# Patient Record
Sex: Female | Born: 1980 | Hispanic: Yes | Marital: Single | Smoking: Never smoker
Health system: Southern US, Community
[De-identification: ages and names within clinical notes are randomized; demographics above are authoritative.]

## PROBLEM LIST (undated history)

## (undated) DIAGNOSIS — O24419 Gestational diabetes mellitus in pregnancy, unspecified control: Secondary | ICD-10-CM

## (undated) DIAGNOSIS — E7800 Pure hypercholesterolemia, unspecified: Secondary | ICD-10-CM

## (undated) HISTORY — DX: Pure hypercholesterolemia, unspecified: E78.00

## (undated) HISTORY — PX: APPENDECTOMY: SHX54

## (undated) HISTORY — DX: Gestational diabetes mellitus in pregnancy, unspecified control: O24.419

---

## 2021-11-09 ENCOUNTER — Other Ambulatory Visit: Payer: Self-pay

## 2021-11-09 DIAGNOSIS — N644 Mastodynia: Secondary | ICD-10-CM

## 2021-12-17 ENCOUNTER — Ambulatory Visit
Admission: RE | Admit: 2021-12-17 | Discharge: 2021-12-17 | Disposition: A | Discharge status: Home / Self Care | Payer: No Typology Code available for payment source | Source: Ambulatory Visit | Attending: Obstetrics and Gynecology | Admitting: Obstetrics and Gynecology

## 2021-12-17 ENCOUNTER — Ambulatory Visit: Payer: Self-pay | Admitting: *Deleted

## 2021-12-17 ENCOUNTER — Ambulatory Visit
Admission: RE | Admit: 2021-12-17 | Discharge: 2021-12-17 | Disposition: A | Discharge status: Home / Self Care | Payer: Self-pay | Source: Ambulatory Visit | Attending: Obstetrics and Gynecology | Admitting: Obstetrics and Gynecology

## 2021-12-17 VITALS — BP 120/80 | Wt 157.1 lb

## 2021-12-17 DIAGNOSIS — N644 Mastodynia: Secondary | ICD-10-CM

## 2021-12-17 DIAGNOSIS — Z1239 Encounter for other screening for malignant neoplasm of breast: Secondary | ICD-10-CM

## 2021-12-17 NOTE — Progress Notes (Signed)
[name] is a 40 y.o. female who presents to [facility] clinic today with complaint of left inner breast pain x 2-3 months that comes and goes. Patient rates the pain at a 6 out of 10.    Pap Smear: Pap smear not completed today. Last Pap smear was in 2021 at a clinic in [place] and was normal per patient. Per patient has no history of an abnormal Pap smear. Patient stated she has a history of a vaginal infection that was treated with antibiotics. Last Pap smear result is not available in Epic.   Physical exam: Breasts Breasts symmetrical. No skin abnormalities bilateral breasts. No nipple retraction bilateral breasts. No nipple discharge bilateral breasts. No lymphadenopathy. No lumps palpated bilateral breasts. Complaints of left breast pain at 12 o'clock and within the lower outer quadrant of breast.        Pelvic/Bimanual Pap is not indicated today per BCCCP guidelines.   Smoking History: Patient has never smoked.   Patient Navigation: Patient education provided. Access to services provided for patient through [place] program. Spanish interpreter Natale Lay from [facility] provided. Patient has food insecurities. Patient given a bag of groceries from the [facility] for [employer] market.   Breast and Cervical Cancer Risk Assessment: Patient has family history of her mother having breast cancer. Patient has no known genetic mutations or history of radiation treatment to the chest before age 89. Patient does not have history of cervical dysplasia, immunocompromised, or DES exposure in-utero.  Risk Assessment     Risk Scores       12/17/2021   Last edited by: Meryl Dare, CMA   5-year risk: 0.8 %   Lifetime risk: 14.4 %            A: BCCCP exam without pap smear Complaint of left breast pain.  P: Referred patient to the [facility] for a diagnostic mammogram. Appointment scheduled Thursday, December 17, 2021 at 1450.  Priscille Heidelberg, RN 12/17/2021 1:48 PM

## 2021-12-17 NOTE — Patient Instructions (Addendum)
Explained breast self awareness with Kelli Newman. Patient did not need a Pap smear today due to last Pap smear was in 2021 per patient. Let her know BCCCP will cover Pap smears every 3 years unless has a history of abnormal Pap smears. Referred patient to the [facility] for a diagnostic mammogram. Appointment scheduled Thursday, December 17, 2021 at 1450. Patient aware of appointment and will be there. Kelli Newman verbalized understanding.  Kelli Newman, Kelli Maser, RN 1:48 PM

## 2023-03-21 ENCOUNTER — Emergency Department (HOSPITAL_BASED_OUTPATIENT_CLINIC_OR_DEPARTMENT_OTHER)
Admission: EM | Admit: 2023-03-21 | Discharge: 2023-03-21 | Disposition: A | Discharge status: Home / Self Care | Payer: Self-pay | Attending: Emergency Medicine | Admitting: Emergency Medicine

## 2023-03-21 ENCOUNTER — Encounter (HOSPITAL_BASED_OUTPATIENT_CLINIC_OR_DEPARTMENT_OTHER): Payer: Self-pay | Admitting: Urology

## 2023-03-21 DIAGNOSIS — R1906 Epigastric swelling, mass or lump: Secondary | ICD-10-CM | POA: Insufficient documentation

## 2023-03-21 DIAGNOSIS — D179 Benign lipomatous neoplasm, unspecified: Secondary | ICD-10-CM

## 2023-03-21 NOTE — Discharge Instructions (Addendum)
You were seen in the emergency room today for a lipoma.  Take ibuprofen or Tylenol as needed for discomfort.  I have consulted social work to help you set up insurance and find a primary care doctor.  Please follow-up with primary care to ensure resolution of symptoms.  Return to the emergency room with new or worsening symptoms.

## 2023-03-21 NOTE — ED Notes (Addendum)
Pt spoke with Child psychotherapist understands with intrepter ABOUT HER UNCOMING APPOINTMENT,  A and she needs to ask for financial aid

## 2023-03-21 NOTE — ED Provider Notes (Signed)
[place] EMERGENCY DEPARTMENT AT [facility] Provider Note   CSN: [id number] Arrival date & time: 03/21/23  1106     History  Chief Complaint  Patient presents with   Mass    Kelli Newman is a 42 y.o. female w/o pmhx presenting with epigastric mass that she noticed 34m ago. She has never had anything like this in the past, she reports mild discomfort to the area, does not feel that the mass has changed.  Patient does not have insurance and patient does not have primary care.  No erythema, fevers or chills associated.   HPI     Home Medications Prior to Admission medications   Medication Sig Start Date End Date Taking? Authorizing Provider  ibuprofen (ADVIL) 200 MG tablet Take 400 mg by mouth every 6 (six) hours as needed.    [provider]      Allergies    Patient has no known allergies.    Review of Systems   Review of Systems  Skin:        Cyst epigastric    Physical Exam Updated Vital Signs BP 110/70   Pulse 68   Temp (!) 97.2 F (36.2 C)   Resp 18   Wt 65.8 kg   SpO2 98%  Physical Exam Vitals and nursing note reviewed.  Constitutional:      General: She is not in acute distress.    Appearance: She is not toxic-appearing.  HENT:     Head: Normocephalic and atraumatic.  Eyes:     General: No scleral icterus.    Conjunctiva/sclera: Conjunctivae normal.  Cardiovascular:     Rate and Rhythm: Normal rate and regular rhythm.     Pulses: Normal pulses.     Heart sounds: Normal heart sounds.  Pulmonary:     Effort: Pulmonary effort is normal. No respiratory distress.     Breath sounds: Normal breath sounds.  Abdominal:     General: Abdomen is flat. Bowel sounds are normal.     Palpations: Abdomen is soft.     Tenderness: There is no abdominal tenderness.  Skin:    General: Skin is warm and dry.     Findings: No lesion.     Comments: Epigastric Small, mobile circular mass - no sign of cellulitis, no sign of abscess    Neurological:     General: No focal deficit present.     Mental Status: She is alert and oriented to person, place, and time. Mental status is at baseline.     ED Results / Procedures / Treatments   Labs (all labs ordered are listed, but only abnormal results are displayed) Labs Reviewed - No data to display  EKG None  Radiology No results found.  Procedures Procedures    Medications Ordered in ED Medications - No data to display  ED Course/ Medical Decision Making/ A&P                                 Medical Decision Making  This patient presents to the ED for concern of mass, this involves an extensive number of treatment options, and is a complaint that carries with it a high risk of complications and morbidity.  The differential diagnosis includes cyst, lipoma, abscess, rash   Co morbidities that complicate the patient evaluation  None   Additional history obtained:  Interpreter used for conversation with patient.   Lab Tests:  I Ordered, and personally interpreted labs.  The pertinent results include:  none   Imaging Studies ordered:  None   Cardiac Monitoring: / EKG:  Vitals wnl   Consultations Obtained:  TOC for insurance and PCP needs   Problem List / ED Course / Critical interventions / Medication management  Reports to the emergency room with 4 months of small mobile soft rubbery mass.  Patient reports that her symptoms have been consistent since she first noticed the mass.  Has not noticed mass anywhere else.  No surrounding erythema, signs of an abscess.  If patient has discomfort is only with palpation to the area.  Given physical exam findings, findings consistent with lipoma. Will consult SW for insurance set up and PCP. I ordered medication including: none  Reevaluation of the patient after these medicines showed that the patient stayed the same I have reviewed the patients home medicines and have made adjustments as  needed   Plan Consulted SW for insurance and primary care needs. Encouraged patient to follow-up with primary care once set up. Patient is stable for discharge and agrees to plan. Return to the emergency room with new or worsening symptoms.        Final Clinical Impression(s) / ED Diagnoses Final diagnoses:  None    Rx / DC Orders ED Discharge Orders     None         Smitty Knudsen, PA-C 03/21/23 1412    Elayne Snare K, DO 03/21/23 1439

## 2023-03-21 NOTE — ED Triage Notes (Signed)
Spanish interpreter used  Pt states epigastic lump, feels like a ball in stomach x 3 months  Denies N/V/D, Denies fever  Denies any indigestion or heartburn  Pain worse with sitting or slouching

## 2023-03-21 NOTE — Care Management (Signed)
Transition of Care [facility]) - Emergency Department Mini Assessment   Patient Details  Name: Kelli Newman MRN: [id number] Date of Birth: 1981-05-09  Transition of Care [facility]) CM/SW Contact:    Lavenia Atlas, RN Phone Number: 03/21/2023, 2:23 PM   Clinical Narrative: Jayme Cloud consult for PCP needs and insurance assistance. Per chart review patient does not have medical insurance or PCP. This RNCM spoke with patient and NT via phone. Patient reports she can get transportation to PCP appointment. PCP appointment to establish care and follow up scheduled for Wednesday 03/23/23 at 2:20pm with [facility]. EDP, NT, RN notified.  No additional TOC needs at this time.     ED Mini Assessment: What brought you to the Emergency Department? : epigastric pain/mass  Barriers to Discharge: ED Uninsured needing PCP establishment  Barrier interventions: coordinating PCP appointment     Interventions which prevented an admission or readmission: Follow-up medical appointment    Patient Contact and Communications        ,          Patient states their goals for this hospitalization and ongoing recovery are:: to feel better CMS Medicare.gov Compare Post Acute Care list provided to:: Patient Choice offered to / list presented to : Patient  Admission diagnosis:  ABD PAIN There are no problems to display for this patient.  PCP:  Yvonne Kendall, NP Pharmacy:   [id number] [place], [place] - [phone] AT [facility] [address] [place] [address] Phone: [phone] Fax: [phone]  [id number] [place], [place] - [address] AT [facility] [address] [place] [address] Phone: [phone] Fax: [phone]  [facility] [address] [place] - [address] [place] [address] Phone: [phone] Fax: [phone]

## 2023-03-23 ENCOUNTER — Ambulatory Visit (INDEPENDENT_AMBULATORY_CARE_PROVIDER_SITE_OTHER): Payer: Self-pay | Admitting: Nurse Practitioner

## 2023-03-23 ENCOUNTER — Encounter: Payer: Self-pay | Admitting: Nurse Practitioner

## 2023-03-23 VITALS — BP 108/67 | HR 72 | Temp 97.2°F | Ht 58.66 in | Wt 145.2 lb

## 2023-03-23 DIAGNOSIS — E782 Mixed hyperlipidemia: Secondary | ICD-10-CM | POA: Insufficient documentation

## 2023-03-23 DIAGNOSIS — R7303 Prediabetes: Secondary | ICD-10-CM | POA: Insufficient documentation

## 2023-03-23 DIAGNOSIS — R1906 Epigastric swelling, mass or lump: Secondary | ICD-10-CM | POA: Insufficient documentation

## 2023-03-23 DIAGNOSIS — E119 Type 2 diabetes mellitus without complications: Secondary | ICD-10-CM | POA: Insufficient documentation

## 2023-03-23 NOTE — Assessment & Plan Note (Signed)
Last A1c was 6.0 Currently not on medication Avoid sugar sweets soda Follow-up in 4 months

## 2023-03-23 NOTE — Patient Instructions (Addendum)
1. Prediabetes   2. Mixed hyperlipidemia   3. Epigastric mass  - Ambulatory referral to General Surgery - US Abdomen Complete    It is important that you exercise regularly at least 30 minutes 5 times a week as tolerated  Think about what you will eat, plan ahead. Choose " clean, green, fresh or frozen" over canned, processed or packaged foods which are more sugary, salty and fatty. 70 to 75% of food eaten should be vegetables and fruit. Three meals at set times with snacks allowed between meals, but they must be fruit or vegetables. Aim to eat over a 12 hour period , example 7 am to 7 pm, and STOP after  your last meal of the day. Drink water,generally about 64 ounces per day, no other drink is as healthy. Fruit juice is best enjoyed in a healthy way, by EATING the fruit.  Thanks for choosing [facility] we consider it a privelige to serve you.

## 2023-03-23 NOTE — Progress Notes (Signed)
New Patient Office Visit  Subjective:  Patient ID: Kelli Newman, female    DOB: 16-Jan-1981  Age: 42 y.o. MRN: [id number]  CC:  Chief Complaint  Patient presents with   Hospitalization Follow-up   Establish Care    HPI Kelli Newman is a 42 y.o. female with past medical history of hyperlipidemia, type 2 diabetes who presents to establish care. She was going  to the  [facility] in [place] , last visit with them was in July 2024.  Stated that she she was on metformin for type 2 diabetes during her pregnancy but that the medication was discontinued after she delivered.   Patient was at the ED on for complaints of epigastric mass that she had noticed 4 months ago.  Patient reports some mild discomfort on palpation but denies fever, chills nausea, vomiting diarrhea.  We discussed getting an abdominal ultrasound to further evaluate her meds and patient was also referred to general surgery.  Patient is accompanied by medical interpreter who assisted with interpretation   Patient is currently uninsured ,information for the [facility] health financial Counsellor provided in the office today     Past Medical History:  Diagnosis Date   Elevated cholesterol    Gestational diabetes     Past Surgical History:  Procedure Laterality Date   APPENDECTOMY      Family History  Problem Relation Age of Onset   Hypertension Mother    Diabetes Mother    Cancer Mother    Breast cancer Mother 72   Hypertension Maternal Grandfather    Diabetes Maternal Grandfather     Social History   Socioeconomic History   Marital status: Single    Spouse name: Not on file   Number of children: 3   Years of education: Not on file   Highest education level: Not on file  Occupational History   Not on file  Tobacco Use   Smoking status: Never   Smokeless tobacco: Never  Vaping Use   Vaping status: Never Used  Substance and Sexual Activity   Alcohol use: Never   Drug use: Never    Sexual activity: Yes    Birth control/protection: None  Other Topics Concern   Not on file  Social History Narrative   Lives with her children    Social Determinants of Health   Financial Resource Strain: Not on File (10/12/2021)   Received from [employer]    Financial Resource Strain: 0  Food Insecurity: Food Insecurity Present (12/17/2021)   Hunger Vital Sign    Worried About Running Out of Food in the Last Year: Sometimes true    Ran Out of Food in the Last Year: Sometimes true  Transportation Needs: No Transportation Needs (12/17/2021)   PRAPARE - Administrator, Civil Service (Medical): No    Lack of Transportation (Non-Medical): No  Physical Activity: Not on File (10/12/2021)   Received from [facility]   Physical Activity    Physical Activity: 0  Stress: Not on File (10/12/2021)   Received from [facility]   Stress    Stress: 0  Social Connections: Not on File (10/12/2021)   Received from [facility]   Social Connections    Connectedness: 0  Intimate Partner Violence: Not on file    ROS Review of Systems  Constitutional: Negative.   HENT: Negative.    Eyes: Negative.   Respiratory: Negative.    Cardiovascular: Negative.   Gastrointestinal:  Negative for anal bleeding,  blood in stool, constipation, diarrhea, nausea and rectal pain.  Endocrine: Negative.   Genitourinary: Negative.   Musculoskeletal: Negative.   Skin: Negative.   Allergic/Immunologic: Negative.   Neurological: Negative.   Psychiatric/Behavioral: Negative.      Objective:   Today's Vitals: BP 108/67   Pulse 72   Temp (!) 97.2 F (36.2 C)   Ht 4' 10.66" (1.49 m)   Wt 145 lb 3.2 oz (65.9 kg)   SpO2 100%   BMI 29.67 kg/m   Physical Exam Vitals and nursing note reviewed.  Constitutional:      General: She is not in acute distress.    Appearance: Normal appearance. She is not ill-appearing, toxic-appearing or diaphoretic.  HENT:     Mouth/Throat:     Mouth: Mucous  membranes are moist.     Pharynx: Oropharynx is clear. No oropharyngeal exudate or posterior oropharyngeal erythema.  Eyes:     General: No scleral icterus.       Right eye: No discharge.        Left eye: No discharge.     Extraocular Movements: Extraocular movements intact.     Conjunctiva/sclera: Conjunctivae normal.  Cardiovascular:     Rate and Rhythm: Normal rate and regular rhythm.     Pulses: Normal pulses.     Heart sounds: Normal heart sounds. No murmur heard.    No friction rub. No gallop.  Pulmonary:     Effort: Pulmonary effort is normal. No respiratory distress.     Breath sounds: Normal breath sounds. No stridor. No wheezing, rhonchi or rales.  Chest:     Chest wall: No tenderness.  Abdominal:     General: There is no distension.     Palpations: Abdomen is soft.     Tenderness: There is no abdominal tenderness. There is no right CVA tenderness, left CVA tenderness or guarding.     Comments: Small mobile epigastric mass palpated, reports mild tenderness on palpation.  No redness or swelling noted  Musculoskeletal:        General: No swelling, tenderness, deformity or signs of injury.     Right lower leg: No edema.     Left lower leg: No edema.  Skin:    General: Skin is warm and dry.     Capillary Refill: Capillary refill takes less than 2 seconds.     Coloration: Skin is not jaundiced or pale.     Findings: No bruising, erythema or lesion.  Neurological:     Mental Status: She is alert and oriented to person, place, and time.     Motor: No weakness.     Coordination: Coordination normal.     Gait: Gait normal.  Psychiatric:        Mood and Affect: Mood normal.        Behavior: Behavior normal.        Thought Content: Thought content normal.        Judgment: Judgment normal.     Assessment & Plan:   Problem List Items Addressed This Visit       Other   Mixed hyperlipidemia    Avoid fatty fried foods      Epigastric mass     - Ambulatory referral  to General Surgery - US Abdomen Complete        Relevant Orders   Ambulatory referral to General Surgery   US Abdomen Complete   RESOLVED: Prediabetes - Primary    Outpatient Encounter Medications as of 03/23/2023  Medication Sig   Ferrous Sulfate (IRON PO) Take 1 tablet by mouth daily. (Patient not taking: Reported on 03/23/2023)   ibuprofen (ADVIL) 200 MG tablet Take 400 mg by mouth every 6 (six) hours as needed. (Patient not taking: Reported on 03/23/2023)   No facility-administered encounter medications on file as of 03/23/2023.    Follow-up: Return in about 4 months (around 07/23/2023) for prediabetes , abdominal mass.   Donell Beers, FNP

## 2023-03-23 NOTE — Assessment & Plan Note (Signed)
Avoid fatty fried foods

## 2023-03-23 NOTE — Assessment & Plan Note (Signed)
-   Ambulatory referral to General Surgery - US Abdomen Complete

## 2023-04-06 ENCOUNTER — Ambulatory Visit (HOSPITAL_COMMUNITY): Admission: RE | Admit: 2023-04-06 | Payer: Self-pay | Source: Ambulatory Visit

## 2023-07-25 ENCOUNTER — Ambulatory Visit: Payer: Self-pay | Admitting: Nurse Practitioner

## 2023-10-15 IMAGING — MG DIGITAL DIAGNOSTIC BILAT W/ TOMO W/ CAD
6 of 12 series · 6 of 36 positions shown · non-contrast
Comparison: None.

CLINICAL DATA: 41-year-old female presenting with diffuse left
breast pain. No associated lump. Family history of breast cancer in
the patient's mother at age 58.

EXAM:
DIGITAL DIAGNOSTIC BILATERAL MAMMOGRAM WITH TOMOSYNTHESIS AND CAD;
ULTRASOUND LEFT BREAST LIMITED
TECHNIQUE: Bilateral digital diagnostic mammography and breast tomosynthesis
was performed. The images were evaluated with computer-aided
detection.; Targeted ultrasound examination of the left breast was
performed.

[L MLO synth-2D]
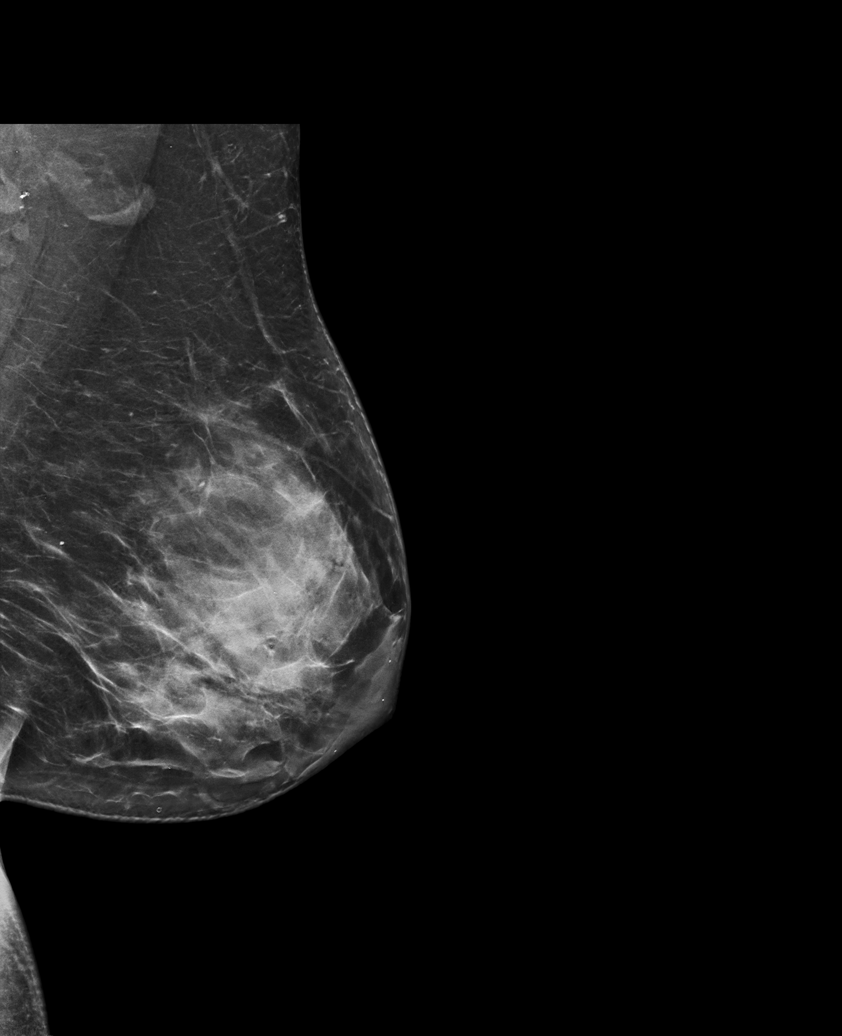

[R MLO synth-2D]
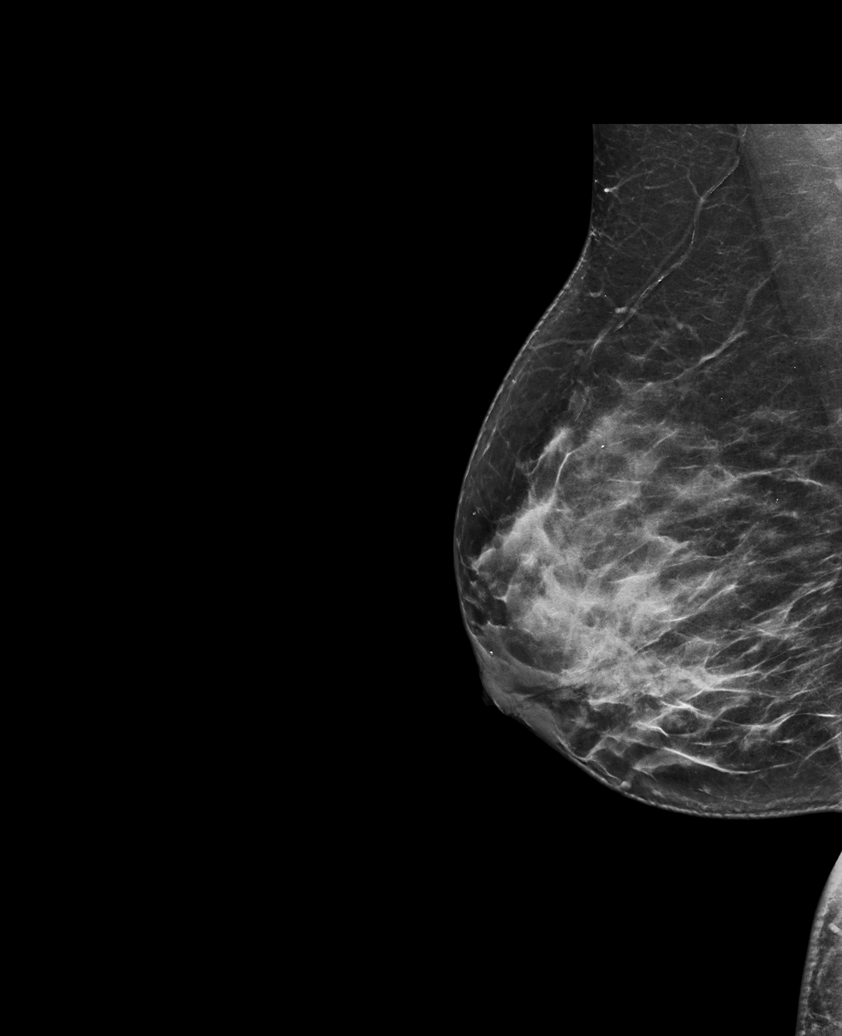

[L CC synth-2D (1 of 2)]
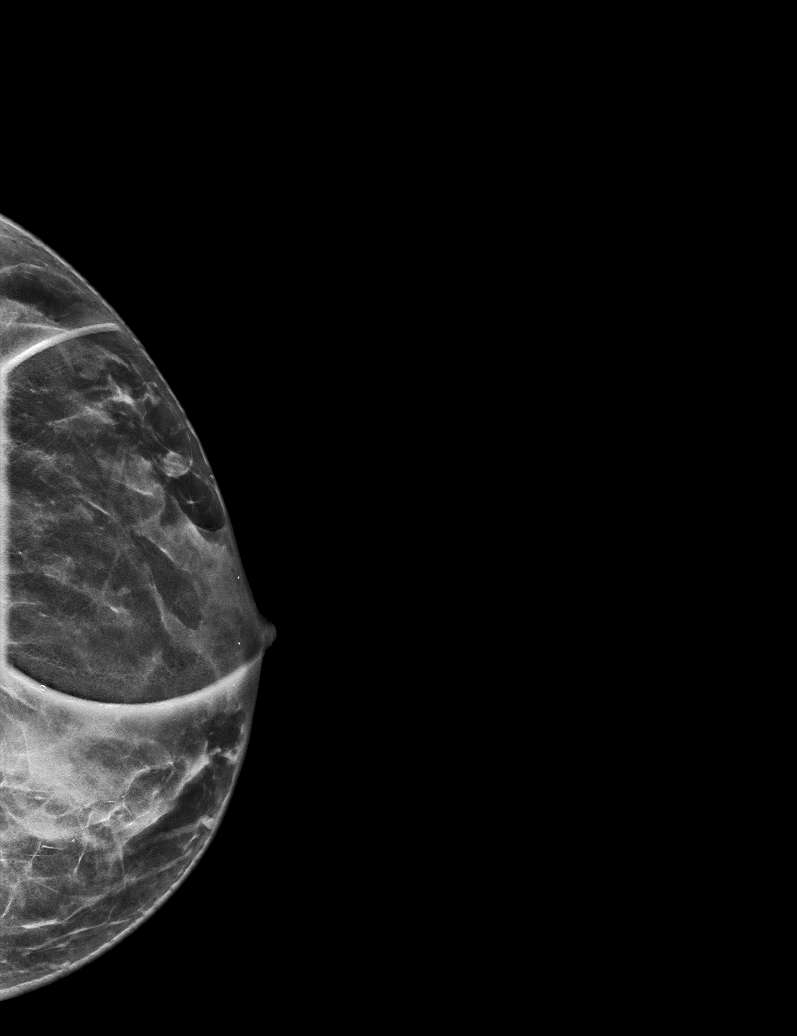

[R CC synth-2D]
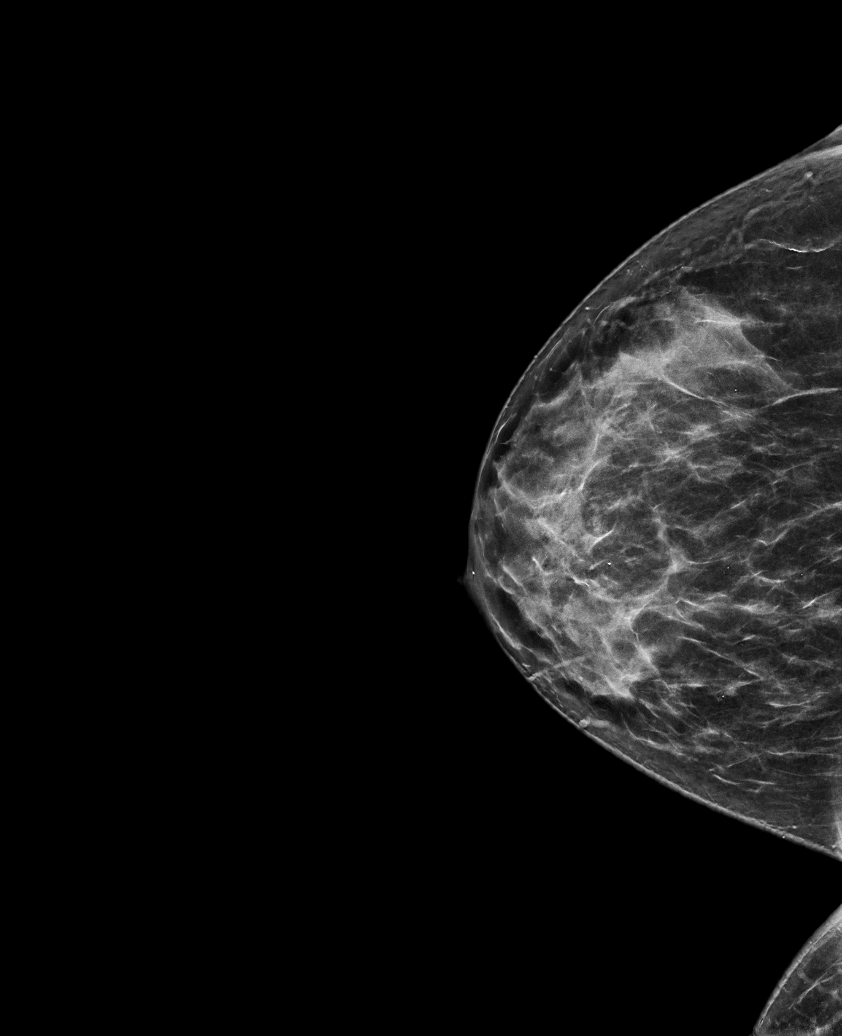

[L ML synth-2D]
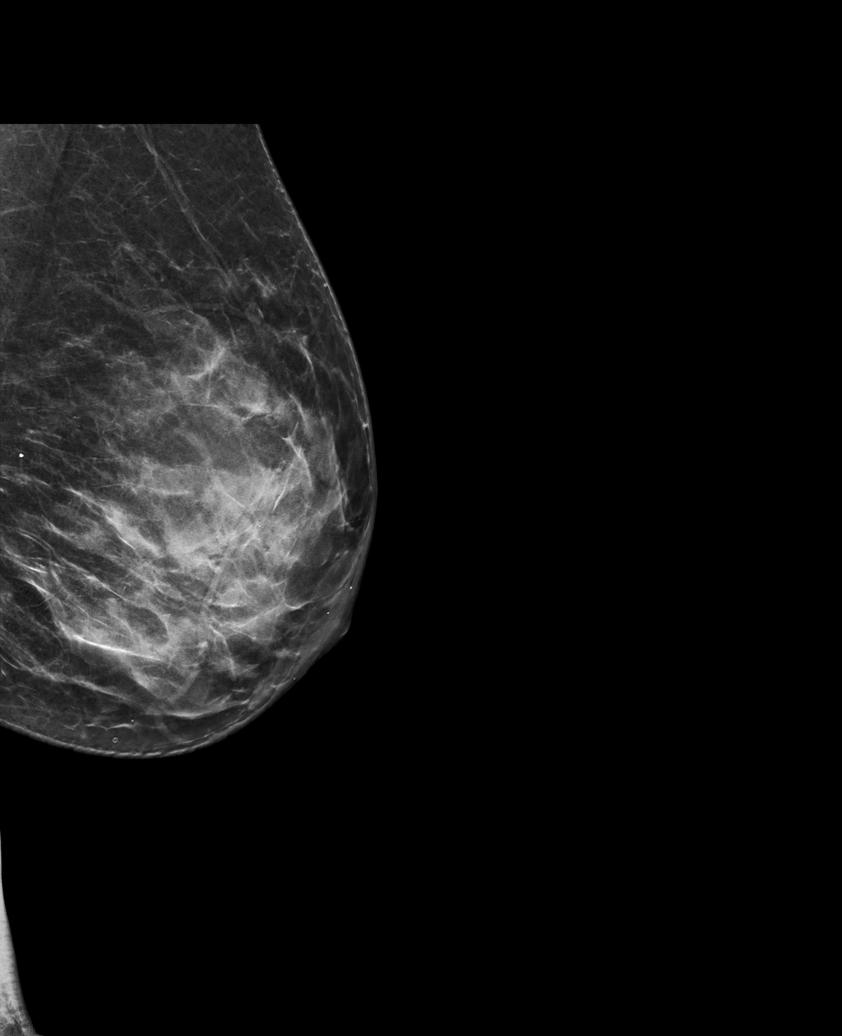

[L CC synth-2D (2 of 2)]
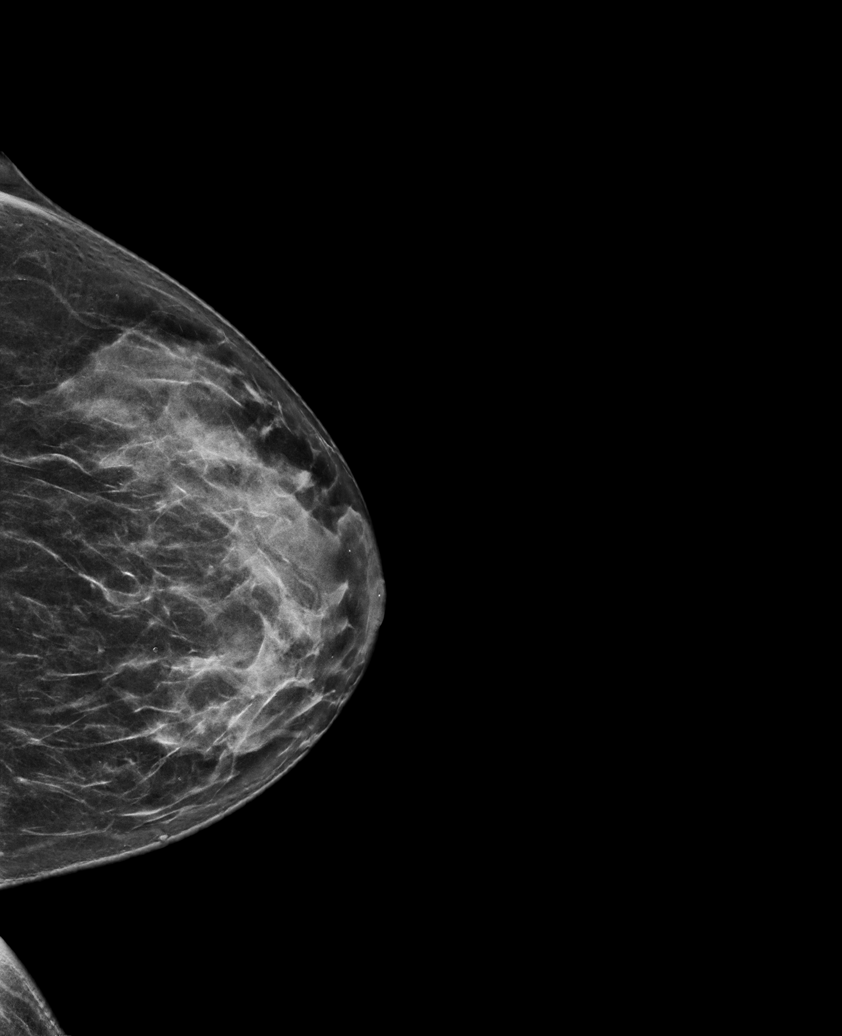

[6 of 36 positions shown; findings below may reference images not displayed]

ACR Breast Density Category c: The breast tissue is heterogeneously
dense, which may obscure small masses.
FINDINGS: Mammogram:

Right breast: No suspicious mass, distortion, or microcalcifications
are identified to suggest presence of malignancy.

Left breast coil spot compression tomosynthesis and full true
lateral tomosynthesis views of the left breast were performed in
addition to standard views. There is a small oval mass that persists
in the outer anterior left breast. There are no additional findings
elsewhere in the left breast.

Ultrasound:

Targeted ultrasound is performed in the left breast at 4 o'clock 2
cm from the nipple demonstrating a cluster of anechoic masses
consistent with a benign cluster of cysts. This measures 0.9 x 0.5 x
0.7 cm. Additionally at 2 o'clock 3 cm from the nipple there is a
similar cluster of cysts measuring 0.5 x 0.3 x 0.4 cm. One of these
corresponds to the mass identified mammographically. No suspicious
solid mass.
IMPRESSION: 1.  Benign small clusters of cysts in the outer left breast.

2.  No mammographic evidence of malignancy in the right breast.

RECOMMENDATION:
1. Clinical follow-up as needed for the left breast pain, which may
be related to fibrocystic changes.

Breast pain is a common condition, which will often resolve on its
own without intervention. It can be affected by hormonal changes,
medication side effect, weight changes and fit of the bra. Pain may
also be referred from other adjacent areas of the body. Breast pain
may be improved by wearing adequate well-fitting support,
over-the-counter topical and oral NSAID medication, low-fat diet,
and ice/heat as needed. Studies have shown an improvement in cyclic
pain with use of evening primrose oil and vitamin E.

2.  Screening mammogram in one year.(Code:0F-8-NTN)

I have discussed the findings and recommendations with the patient.
If applicable, a reminder letter will be sent to the patient
regarding the next appointment.

BI-RADS CATEGORY  2: Benign.

## 2024-01-04 ENCOUNTER — Other Ambulatory Visit: Payer: Self-pay | Admitting: Nurse Practitioner

## 2024-01-04 DIAGNOSIS — Z0000 Encounter for general adult medical examination without abnormal findings: Secondary | ICD-10-CM
